# Patient Record
Sex: Male | Born: 1950 | Race: White | Hispanic: No | Marital: Married | Smoking: Former smoker
Health system: Southern US, Community
[De-identification: ages and names within clinical notes are randomized; demographics above are authoritative.]

## PROBLEM LIST (undated history)

## (undated) DIAGNOSIS — F32A Depression, unspecified: Secondary | ICD-10-CM

## (undated) DIAGNOSIS — C801 Malignant (primary) neoplasm, unspecified: Secondary | ICD-10-CM

## (undated) DIAGNOSIS — Z860100 Personal history of colon polyps, unspecified: Secondary | ICD-10-CM

## (undated) DIAGNOSIS — E785 Hyperlipidemia, unspecified: Secondary | ICD-10-CM

## (undated) DIAGNOSIS — F329 Major depressive disorder, single episode, unspecified: Secondary | ICD-10-CM

## (undated) DIAGNOSIS — K5790 Diverticulosis of intestine, part unspecified, without perforation or abscess without bleeding: Secondary | ICD-10-CM

## (undated) DIAGNOSIS — H409 Unspecified glaucoma: Secondary | ICD-10-CM

## (undated) DIAGNOSIS — F419 Anxiety disorder, unspecified: Secondary | ICD-10-CM

## (undated) DIAGNOSIS — K219 Gastro-esophageal reflux disease without esophagitis: Secondary | ICD-10-CM

## (undated) DIAGNOSIS — I10 Essential (primary) hypertension: Secondary | ICD-10-CM

## (undated) DIAGNOSIS — M1990 Unspecified osteoarthritis, unspecified site: Secondary | ICD-10-CM

## (undated) HISTORY — DX: Depression, unspecified: F32.A

## (undated) HISTORY — DX: Anxiety disorder, unspecified: F41.9

## (undated) HISTORY — DX: Hyperlipidemia, unspecified: E78.5

## (undated) HISTORY — DX: Gastro-esophageal reflux disease without esophagitis: K21.9

## (undated) HISTORY — DX: Malignant (primary) neoplasm, unspecified: C80.1

## (undated) HISTORY — DX: Major depressive disorder, single episode, unspecified: F32.9

## (undated) HISTORY — DX: Unspecified glaucoma: H40.9

## (undated) HISTORY — DX: Personal history of colonic polyps: Z86.010

## (undated) HISTORY — DX: Unspecified osteoarthritis, unspecified site: M19.90

## (undated) HISTORY — PX: OTHER SURGICAL HISTORY: SHX169

## (undated) HISTORY — PX: WRIST SURGERY: SHX841

## (undated) HISTORY — DX: Diverticulosis of intestine, part unspecified, without perforation or abscess without bleeding: K57.90

## (undated) HISTORY — DX: Personal history of colon polyps, unspecified: Z86.0100

---

## 2005-08-17 ENCOUNTER — Encounter: Payer: Self-pay | Admitting: Gastroenterology

## 2008-08-19 ENCOUNTER — Emergency Department (HOSPITAL_BASED_OUTPATIENT_CLINIC_OR_DEPARTMENT_OTHER): Admission: EM | Admit: 2008-08-19 | Discharge: 2008-08-19 | Payer: Self-pay | Admitting: Emergency Medicine

## 2009-05-19 ENCOUNTER — Encounter (INDEPENDENT_AMBULATORY_CARE_PROVIDER_SITE_OTHER): Payer: Self-pay | Admitting: *Deleted

## 2009-05-25 ENCOUNTER — Encounter (INDEPENDENT_AMBULATORY_CARE_PROVIDER_SITE_OTHER): Payer: Self-pay | Admitting: *Deleted

## 2009-05-30 ENCOUNTER — Ambulatory Visit: Payer: Self-pay | Admitting: Gastroenterology

## 2009-06-03 ENCOUNTER — Telehealth: Payer: Self-pay | Admitting: Gastroenterology

## 2009-06-07 ENCOUNTER — Ambulatory Visit: Payer: Self-pay | Admitting: Gastroenterology

## 2009-06-07 ENCOUNTER — Telehealth: Payer: Self-pay | Admitting: Gastroenterology

## 2010-02-14 NOTE — Miscellaneous (Signed)
Summary: [facility] Previsit/prep  Clinical Lists Changes  Medications: Added new medication of MOVIPREP 100 GM  SOLR (PEG-KCL-NACL-NASULF-NA ASC-C) As per prep instructions. - Signed Rx of MOVIPREP 100 GM  SOLR (PEG-KCL-NACL-NASULF-NA ASC-C) As per prep instructions.;  #1 x 0;  Signed;  Entered by: Wyona Almas RN;  Authorized by: Rachael Fee MD;  Method used: Electronically to [facility]. [phone]*, [address], [place], [place], [place]  [address], Ph: [phone] or [phone], Fax: [phone] Allergies: Added new allergy or adverse reaction of CODEINE Observations: Added new observation of NKA: F (05/30/2009 12:47)    Prescriptions: MOVIPREP 100 GM  SOLR (PEG-KCL-NACL-NASULF-NA ASC-C) As per prep instructions.  #1 x 0   Entered by:   Wyona Almas RN   Authorized by:   Rachael Fee MD   Signed by:   Wyona Almas RN on 05/30/2009   Method used:   Electronically to        [facility]. [phone]* (retail)       [address]       [place], [place]  [address]       Ph: [phone] or [phone]       Fax: [phone]   RxID:   [id number]

## 2010-02-14 NOTE — Letter (Signed)
Summary: Previsit letter  [facility]  [address] [place], [place] [address]   Phone: [phone]  Fax: [phone]       05/19/2009 MRN: [id number]  Samuel Knight [address] [place], [place]  [address]  Dear Mr. BASTIN,  Welcome to the Gastroenterology Division at [facility].    You are scheduled to see a nurse for your pre-procedure visit on 05-27-09 at 2pm on the 3rd floor at [facility], [address]  We ask that you try to arrive at our office 15 minutes prior to your appointment time to allow for check-in.  Your nurse visit will consist of discussing your medical and surgical history, your immediate family medical history, and your medications.    Please bring a complete list of all your medications or, if you prefer, bring the medication bottles and we will list them.  We will need to be aware of both prescribed and over the counter drugs.  We will need to know exact dosage information as well.  If you are on blood thinners (Coumadin, Plavix, Aggrenox, Ticlid, etc.) please call our office today/prior to your appointment, as we need to consult with your physician about holding your medication.   Please be prepared to read and sign documents such as consent forms, a financial agreement, and acknowledgement forms.  If necessary, and with your consent, a friend or relative is welcome to sit-in on the nurse visit with you.  Please bring your insurance card so that we may make a copy of it.  If your insurance requires a referral to see a specialist, please bring your referral form from your primary care physician.  No co-pay is required for this nurse visit.     If you cannot keep your appointment, please call [phone] to cancel or reschedule prior to your appointment date.  This allows [place] the opportunity to schedule an appointment for another patient in need of care.    Thank you for choosing [facility] for your medical needs.  We  appreciate the opportunity to care for you.  Please visit [place] at our website  to learn more about our practice.                     Sincerely.                                                                                                                   The Gastroenterology Division

## 2010-02-14 NOTE — Letter (Signed)
Summary: Moviprep Instructions  [facility]  [address]   [place], [place] [address]   Phone: [phone]  Fax: [phone]       CATHY ROPP    1950-10-14    MRN: [id number]        Procedure Day /Date: Tuesday    06-07-09     Arrival Time: 10:30 a.m.     Procedure Time: 11:30 a.m.     Location of Procedure:                     x   Endoscopy Center (4th Floor)                        PREPARATION FOR COLONOSCOPY WITH MOVIPREP   Starting 5 days prior to your procedure  06-02-09 do not eat nuts, seeds, popcorn, corn, beans, peas,  salads, or any raw vegetables.  Do not take any fiber supplements (e.g. Metamucil, Citrucel, and Benefiber).  THE DAY BEFORE YOUR PROCEDURE         DATE:  06-06-09  DAY: Monday  1.  Drink clear liquids the entire day-NO SOLID FOOD  2.  Do not drink anything colored red or purple.  Avoid juices with pulp.  No orange juice.  3.  Drink at least 64 oz. (8 glasses) of fluid/clear liquids during the day to prevent dehydration and help the prep work efficiently.  CLEAR LIQUIDS INCLUDE: Water Jello Ice Popsicles Tea (sugar ok, no milk/cream) Powdered fruit flavored drinks Coffee (sugar ok, no milk/cream) Gatorade Juice: apple, white grape, white cranberry  Lemonade Clear bullion, consomm, broth Carbonated beverages (any kind) Strained chicken noodle soup Hard Candy                             4.  In the morning, mix first dose of MoviPrep solution:    Empty 1 Pouch A and 1 Pouch B into the disposable container    Add lukewarm drinking water to the top line of the container. Mix to dissolve    Refrigerate (mixed solution should be used within 24 hrs)  5.  Begin drinking the prep at 5:00 p.m. The MoviPrep container is divided by 4 marks.   Every 15 minutes drink the solution down to the next mark (approximately 8 oz) until the full liter is complete.   6.  Follow completed prep with 16 oz of clear liquid of your choice  (Nothing red or purple).  Continue to drink clear liquids until bedtime.  7.  Before going to bed, mix second dose of MoviPrep solution:    Empty 1 Pouch A and 1 Pouch B into the disposable container    Add lukewarm drinking water to the top line of the container. Mix to dissolve    Refrigerate  THE DAY OF YOUR PROCEDURE      DATE:  06-07-09  DAY: Tuesday  Beginning at 6:30 a.m. (5 hours before procedure):         1. Every 15 minutes, drink the solution down to the next mark (approx 8 oz) until the full liter is complete.  2. Follow completed prep with 16 oz. of clear liquid of your choice.    3. You may drink clear liquids until  9:30 a.m.  (2 HOURS BEFORE PROCEDURE).   MEDICATION INSTRUCTIONS  Unless otherwise instructed, you should take regular prescription medications with a small sip of water  as early as possible the morning of your procedure.         OTHER INSTRUCTIONS  You will need a responsible adult at least 59 years of age to accompany you and drive you home.   This person must remain in the waiting room during your procedure.  Wear loose fitting clothing that is easily removed.  Leave jewelry and other valuables at home.  However, you may wish to bring a book to read or  an iPod/MP3 player to listen to music as you wait for your procedure to start.  Remove all body piercing jewelry and leave at home.  Total time from sign-in until discharge is approximately 2-3 hours.  You should go home directly after your procedure and rest.  You can resume normal activities the  day after your procedure.  The day of your procedure you should not:   Drive   Make legal decisions   Operate machinery   Drink alcohol   Return to work  You will receive specific instructions about eating, activities and medications before you leave.    The above instructions have been reviewed and explained to me by   Wyona Almas RN  May 30, 2009 1:19 PM     I fully  understand and can verbalize these instructions _____________________________ Date _________

## 2010-02-14 NOTE — Procedures (Signed)
Summary: [facility]   Imported By: Sherian Rein 06/14/2009 09:18:26  _____________________________________________________________________  External Attachment:    Type:   Image     Comment:   External Document

## 2010-02-14 NOTE — Progress Notes (Signed)
Summary: Triage  Phone Note Call from Patient Call back at [phone]   Caller: Patient Call For: Dr. Christella Hartigan Reason for Call: Talk to Nurse Summary of Call: pt. has a procedure sch'd for today. Finished last night's prep and started on it this morning and started vomiting and "gagging". Initial call taken by: Karna Christmas,  Jun 07, 2009 8:06 AM  Follow-up for Phone Call        Phone call returned to pt.  He drank all of the first bottle of the moviprep last pm.  This am he drank 3/4 of the bottle and had n & v.  I asked the pt what his last bm looked like.  Per pt, "it's all liquid, yellowish colored."  I informed the pt.  we would proceed with the colonoscopy this am.  Pt is scheduled for 11:30 and to arrive at 10:30.  Pt will not try to finish the rest of the prep. Follow-up by: Eual Fines RN,  Jun 07, 2009 8:39 AM

## 2010-02-14 NOTE — Procedures (Signed)
Summary: Colonoscopy  Patient: Samuel Knight Note: All result statuses are Final unless otherwise noted.  Tests: (1) Colonoscopy (COL)   COL Colonoscopy           DONE     [facility]     [address]     [place], [place]  [address]           COLONOSCOPY PROCEDURE REPORT           PATIENT:  Samuel Knight, Samuel Knight  MR#:  [id number]     BIRTHDATE:  06/01/1950, 58 yrs. old  GENDER:  male     ENDOSCOPIST:  Rachael Fee, MD     REF. BY:  Feliciana Rossetti, M.D.     PROCEDURE DATE:  06/07/2009     PROCEDURE:  Diagnostic Colonoscopy     ASA CLASS:  Class II     INDICATIONS:  history of pre-cancerous (adenomatous) colon polyps,     2007 Dr. Jennye Boroughs     MEDICATIONS:   Fentanyl 75 mcg IV, Versed 8 mg IV           DESCRIPTION OF PROCEDURE:   After the risks benefits and     alternatives of the procedure were thoroughly explained, informed     consent was obtained.  Digital rectal exam was performed and     revealed no rectal masses.   The LB [id number] endoscope     was introduced through the anus and advanced to the cecum, which     was identified by both the appendix and ileocecal valve, without     limitations.  The quality of the prep was good, using MoviPrep.     The instrument was then slowly withdrawn as the colon was fully     examined.     <<PROCEDUREIMAGES>>           FINDINGS:  Mild diverticulosis was found in the sigmoid to     descending colon segments (see image2).  This was otherwise a     normal examination of the colon (see image4, image5, and image6).     Retroflexed views in the rectum revealed no abnormalities.    The     scope was then withdrawn from the patient and the procedure     completed.           COMPLICATIONS:  None     ENDOSCOPIC IMPRESSION:     1) Mild diverticulosis in the sigmoid to descending colon     segments     2) Otherwise normal examination     3) No polyps or cancers           RECOMMENDATIONS:     1) Given your personal  history of precancerous polyps (and     possible family history of colon cancer), you should have a repeat     colonoscopy in 5 years           REPEAT EXAM:  5 years           ______________________________     Rachael Fee, MD           n.     eSIGNED:   Rachael Fee at 06/07/2009 12:23 PM           Jaclynn Guarneri, [id number]  Note: An exclamation mark (!) indicates a result that was not dispersed into the flowsheet. Document Creation Date: 06/07/2009 12:24 PM _______________________________________________________________________  (1) Order result status: Final Collection or  observation date-time: 06/07/2009 12:20 Requested date-time:  Receipt date-time:  Reported date-time:  Referring Physician:   Ordering Physician: Rob Bunting [phone]) Specimen Source:  Source: Launa Grill Order Number: [id number] Lab site:   Appended Document: Colonoscopy    Clinical Lists Changes  Observations: Added new observation of COLONNXTDUE: 05/2014 (06/07/2009 13:12)

## 2010-02-14 NOTE — Progress Notes (Signed)
Summary: request gi record from hosp  Phone Note Call from Patient Call back at Home Phone [phone]   Caller: Spouse Call For: Samuel Knight Reason for Call: Talk to Nurse Summary of Call: Wife tried getting her husbands old colon report from [facility]. but they tol dher that we needed to fax over a request for them to fax over his records. Pateitn is schedule to have a colon on Tues. Please call [phone] Hosp. Initial call taken by: Tawni Levy,  Jun 03, 2009 10:28 AM  Follow-up for Phone Call        release faxed  Follow-up by: Chales Abrahams CMA Duncan Dull),  Jun 03, 2009 10:41 AM

## 2013-12-30 ENCOUNTER — Emergency Department: Payer: Self-pay | Admitting: Emergency Medicine

## 2013-12-30 LAB — BASIC METABOLIC PANEL
Anion Gap: 6 — ABNORMAL LOW (ref 7–16)
BUN: 14 mg/dL (ref 7–18)
CHLORIDE: 107 mmol/L (ref 98–107)
CO2: 27 mmol/L (ref 21–32)
Calcium, Total: 8.5 mg/dL (ref 8.5–10.1)
Creatinine: 1.05 mg/dL (ref 0.60–1.30)
EGFR (African American): >60
Glucose: 118 mg/dL — ABNORMAL HIGH (ref 65–99)
OSMOLALITY: 281 (ref 275–301)
Potassium: 4.2 mmol/L (ref 3.5–5.1)
SODIUM: 140 mmol/L (ref 136–145)

## 2013-12-30 LAB — CBC
HCT: 47.5 % (ref 40.0–52.0)
HGB: 16.2 g/dL (ref 13.0–18.0)
MCH: 33.7 pg (ref 26.0–34.0)
MCHC: 34.1 g/dL (ref 32.0–36.0)
MCV: 99 fL (ref 80–100)
Platelet: 160 10*3/uL (ref 150–440)
RBC: 4.8 10*6/uL (ref 4.40–5.90)
RDW: 13.1 % (ref 11.5–14.5)
WBC: 4.8 10*3/uL (ref 3.8–10.6)

## 2013-12-30 LAB — TROPONIN I

## 2014-01-14 ENCOUNTER — Telehealth: Payer: Self-pay

## 2014-01-14 NOTE — Telephone Encounter (Signed)
Called pt to schedule a ED f/u. Pt states he has already taken care of his appt with his "medical Dr. Marland Kitchen

## 2014-03-24 ENCOUNTER — Encounter: Payer: Self-pay | Admitting: Gastroenterology

## 2014-04-02 ENCOUNTER — Encounter: Payer: Self-pay | Admitting: *Deleted

## 2014-04-02 ENCOUNTER — Ambulatory Visit: Payer: Self-pay | Admitting: Nurse Practitioner

## 2014-04-27 ENCOUNTER — Ambulatory Visit: Payer: Self-pay | Admitting: Nurse Practitioner

## 2014-04-28 ENCOUNTER — Telehealth: Payer: Self-pay | Admitting: *Deleted

## 2014-04-28 NOTE — Telephone Encounter (Signed)
Called patient to advise he missed his appointment with [place] yesterday with Tye Savoy NP-C 04-27-2014.  He said his wife remembered and ask him last night if he went to his appointment. He forgot.  I rescheduled him for 05-12-2014 at 3:00 PM with Amy Estewrood PA-C.

## 2014-05-12 ENCOUNTER — Ambulatory Visit (INDEPENDENT_AMBULATORY_CARE_PROVIDER_SITE_OTHER): Payer: BLUE CROSS/BLUE SHIELD | Admitting: Physician Assistant

## 2014-05-12 ENCOUNTER — Encounter: Payer: Self-pay | Admitting: Physician Assistant

## 2014-05-12 VITALS — BP 130/68 | HR 68 | Ht 67.0 in | Wt 194.0 lb

## 2014-05-12 DIAGNOSIS — Z1211 Encounter for screening for malignant neoplasm of colon: Secondary | ICD-10-CM | POA: Diagnosis not present

## 2014-05-12 DIAGNOSIS — Z86010 Personal history of colonic polyps: Secondary | ICD-10-CM | POA: Insufficient documentation

## 2014-05-12 DIAGNOSIS — K219 Gastro-esophageal reflux disease without esophagitis: Secondary | ICD-10-CM

## 2014-05-12 DIAGNOSIS — E785 Hyperlipidemia, unspecified: Secondary | ICD-10-CM | POA: Diagnosis not present

## 2014-05-12 MED ORDER — MOVIPREP 100 G PO SOLR: 1.0000 | ORAL | Status: DC

## 2014-05-12 NOTE — Patient Instructions (Signed)
You have been scheduled for an endoscopy and colonoscopy. Please follow the written instructions given to you at your visit today. Please pick up your prep supplies at the pharmacy within the next 1-3 days. [place], [place]. If you use inhalers (even only as needed), please bring them with you on the day of your procedure. Your physician has requested that you go to www.startemmi.com and enter the access code given to you at your visit today. This web site gives a general overview about your procedure. However, you should still follow specific instructions given to you by our office regarding your preparation for the procedure.

## 2014-05-12 NOTE — Progress Notes (Signed)
i agree with the above note, plan 

## 2014-05-12 NOTE — Progress Notes (Signed)
Patient ID: Samuel Knight, male   DOB: 06/19/1950, 64 y.o.   MRN: [id number]   Subjective:    Patient ID: Samuel Knight, male    DOB: 06/28/1950, 64 y.o.   MRN: [id number]  HPI Samuel Knight is a pleasant 64 yo male known to Dr. Jacobs from prior colonoscopy who comes in today to discuss follow-up colonoscopy. When his wife made this appointment he was having some problems with diarrhea which she says lasted for about a week and has since resolved. No current complaints of abdominal pain and changes in bowel habits melena or hematochezia. Last colonoscopy was done in May 2011 he had mild left-sided diverticulosis and no polyps that colonoscopy in 2007 with Dr. Misenheimer with adenomatous polyps. He was slated for 5 year interval follow-up. His prior records states family history of colon cancer but he tells me today he does not have family history of colon cancer. Patient has recently started on omeprazole 20 mg by mouth daily over the past few months for reflux symptoms says he has had problems for 8 or 9 years with intermittent heartburn and indigestion no dysphagia and [facility] omeprazole. He says this does help control his symptoms very well. He has not had prior EGD.  Review of Systems Pertinent positive and negative review of systems were noted in the above HPI section.  All other review of systems was otherwise negative.  Outpatient Encounter Prescriptions as of 05/12/2014  Medication Sig  . aspirin 81 MG tablet Take 81 mg by mouth daily.  . buPROPion (WELLBUTRIN SR) 150 MG 12 hr tablet Take 150 mg by mouth 2 (two) times daily.  . Dorzolamide HCl-Timolol Mal PF 22.3-6.8 MG/ML SOLN Place 1 drop into both eyes 2 (two) times daily.  . dorzolamide-timolol (COSOPT) 22.3-6.8 MG/ML ophthalmic solution Place 1 drop into both eyes daily.  . fexofenadine (ALLEGRA) 180 MG tablet Take 180 mg by mouth 2 (two) times daily.  . gemfibrozil  (LOPID) 600 MG tablet Take 600 mg by mouth 2 (two) times daily before a meal.  . omeprazole (PRILOSEC) 20 MG capsule Take 20 mg by mouth daily.  . MOVIPREP 100 G SOLR Take 1 kit (200 g total) by mouth as directed.   Allergies  Allergen Reactions  . Codeine     REACTION: nausea  . Phenergan [Promethazine Hcl] Other (See Comments)    Lightheaded and nervousness   Patient Active Problem List   Diagnosis Date Noted  . Hyperlipidemia 05/12/2014  . GERD (gastroesophageal reflux disease) 05/12/2014  . Hx of adenomatous colonic polyps 05/12/2014   History   Social History  . Marital Status: Married    Spouse Name: N/A  . Number of Children: 1  . Years of Education: N/A   Occupational History  . shift manager    Social History Main Topics  . Smoking status: Former Smoker    Types: Cigarettes    Quit date: 05/12/1999  . Smokeless tobacco: Never Used  . Alcohol Use: No  . Drug Use: No  . Sexual Activity: Not on file   Other Topics Concern  . Not on file   Social History Narrative    Mr. Hapke's family history includes Alcoholism in his father; Breast cancer in his mother; Emphysema in his father; Lung cancer in his brother; Throat cancer in his brother.      Objective:    Filed Vitals:   05/12/14 1453    BP: 130/68  Pulse: 68    Physical Exam  well-developed older white male in no acute distress, pleasant blood pressure 138/68 pulse 68 height 5 foot 7 weight 194. HEENT; nontraumatic normocephalic EOMI PERRLA sclera anicteric, Supple; no JVD, Cardiovascular; regular rate and rhythm with S1-S2 no murmur or gallop, Pulmonary; clear bilaterally, Abdomen ;soft nontender nondistended bowel sounds are active no palpable mass or hepatosplenomegaly, Rectal; exam not done, Ext; no clubbing cyanosis or edema skin warm and dry, Psych ;mood and affect appropriate       Assessment & Plan:   #1 64 yo male with hx of adenomatous polyps 2007, negative colon 2011- her for follow up  colonoscopy. #2 chronic GERD x 8-9 years ,recently started PPI - r/o Barretts #3 hyperlipidemia  Plan; Will schedule for colonoscopy and EGD with Dr Ardis Hughs- procedures discussed in detail with pt and he is agreeable to proceed. He will continue Omepraole  20 mg po daily in am   Amy S Esterwood PA-C 05/12/2014   Cc: No ref. provider found

## 2014-05-13 ENCOUNTER — Encounter: Payer: Self-pay | Admitting: Gastroenterology

## 2014-06-07 ENCOUNTER — Encounter: Payer: Self-pay | Admitting: Gastroenterology

## 2014-06-11 ENCOUNTER — Encounter: Payer: BLUE CROSS/BLUE SHIELD | Admitting: Gastroenterology

## 2014-10-15 ENCOUNTER — Encounter: Payer: Self-pay | Admitting: Gastroenterology

## 2014-10-26 ENCOUNTER — Ambulatory Visit (INDEPENDENT_AMBULATORY_CARE_PROVIDER_SITE_OTHER): Payer: BLUE CROSS/BLUE SHIELD | Admitting: Gastroenterology

## 2014-10-26 ENCOUNTER — Encounter: Payer: Self-pay | Admitting: Gastroenterology

## 2014-10-26 VITALS — BP 112/70 | HR 60 | Ht 67.0 in | Wt 193.4 lb

## 2014-10-26 DIAGNOSIS — K219 Gastro-esophageal reflux disease without esophagitis: Secondary | ICD-10-CM | POA: Diagnosis not present

## 2014-10-26 DIAGNOSIS — R194 Change in bowel habit: Secondary | ICD-10-CM | POA: Diagnosis not present

## 2014-10-26 DIAGNOSIS — R198 Other specified symptoms and signs involving the digestive system and abdomen: Secondary | ICD-10-CM

## 2014-10-26 MED ORDER — MOVIPREP 100 G PO SOLR: 1.0000 | Freq: Once | ORAL | Status: DC

## 2014-10-26 NOTE — Patient Instructions (Signed)
You will be set up for a colonoscopy for change in bowels, constipation. Please start taking citrucel (orange flavored) powder fiber supplement.  This may cause some bloating at first but that usually goes away. Begin with a small spoonful and work your way up to a large, heaping spoonful daily over a week. You will be set up for an upper endoscopy to screen for Barrett's, chronic GERD.

## 2014-10-26 NOTE — Progress Notes (Signed)
Review of pertinent gastrointestinal problems: 1. Adenomatous polyp in colon: 08/2005 colonsocopy Dr. Trenton Founds found 3 small (subCM polyps) one was adenomatous on pathology. Repeat colonoscopy 2011 Dr. Ardis Hughs found no polyps.  Was recommended to have recall at 5 year interval. 2. Diverticulosis noted on 2007, 2011 colonoscopies  HPI: This is a  very pleasant 64 year old man whom I last saw about 5 years ago.  Chief complaint is change in bowels, constipation  He was scheduled for EGD and  Colonoscopy but did not show.  He has had recent constipation; This has been for the past 1-2 months. No bleeding.  No new meds (no pain meds,  No BP meds).  Has minor intermittent pains in abd  Had a CT scan 09/2014, tells me no issues with GI tract.  But + lesion in kidney. Follow up MRI showed no problem in kidneys  He has had GERD symptoms for 8-10 years. Currently well controlled on proton pump inhibitor once daily. He has never had Barrett's screening. He has no alarm symptoms of dysphagia, unintentional weight loss.  Sister with IBS, mother with constipation.  No colon cancer in family.  No change in frequency of BMs, just has to push and strain to have BMs.  Has been losing weight.    Was seen in office 04/2014 AE, GERD for 9 years, recommended to have EGD and colonoscopy at that time (colon for polyp follow up, EGD for Barrett's screening)   Past Medical History  Diagnosis Date  . Diverticulosis   . History of colon polyps   . Anxiety   . Arthritis   . 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 Depression   . Glaucoma   . HLD (hyperlipidemia)   . GERD (gastroesophageal reflux disease)     Past Surgical History  Procedure Laterality Date  . Wrist surgery Right     cyst removal   . Eye lid surgery Right     cancer    Current Outpatient Prescriptions  Medication Sig Dispense Refill  . aspirin 81 MG tablet Take 81 mg by mouth daily.    Marland Kitchen buPROPion (WELLBUTRIN SR) 150 MG 12 hr  tablet Take 150 mg by mouth 2 (two) times daily.    . Dorzolamide HCl-Timolol Mal PF 22.3-6.8 MG/ML SOLN Place 1 drop into both eyes 2 (two) times daily.    . dorzolamide-timolol (COSOPT) 22.3-6.8 MG/ML ophthalmic solution Place 1 drop into both eyes daily.    . fexofenadine (ALLEGRA) 180 MG tablet Take 180 mg by mouth 2 (two) times daily.    Marland Kitchen gemfibrozil (LOPID) 600 MG tablet Take 600 mg by mouth 2 (two) times daily before a meal.    . omeprazole (PRILOSEC) 20 MG capsule Take 20 mg by mouth daily.    Marland Kitchen PRESCRIPTION MEDICATION Take by mouth daily.     No current facility-administered medications for this visit.    Allergies as of 10/26/2014 - Review Complete 10/26/2014  Allergen Reaction Noted  . Codeine  05/30/2009  . Phenergan [promethazine hcl] Other (See Comments) 05/12/2014    Family History  Problem Relation Age of Onset  . Breast cancer Mother   . Lung cancer Brother   . Throat cancer Brother   . Emphysema Father   . Alcoholism Father     Social History   Social History  . Marital Status: Married    Spouse Name: N/A  . Number of Children: 1  . Years of Education: N/A   Occupational  History  . shift manager    Social History Main Topics  . Smoking status: Former Smoker    Types: Cigarettes    Quit date: 05/12/1999  . Smokeless tobacco: Never Used  . Alcohol Use: No  . Drug Use: No  . Sexual Activity: Not on file   Other Topics Concern  . Not on file   Social History Narrative     Physical Exam: BP 112/70 mmHg  Pulse 60  Ht 5\' 7"  (1.702 m)  Wt 193 lb 6 oz (87.714 kg)  BMI 30.28 kg/m2 Constitutional: generally well-appearing Psychiatric: alert and oriented x3 Abdomen: soft, nontender, nondistended, no obvious ascites, no peritoneal signs, normal bowel sounds   Assessment and plan: 64 y.o. male with change in bowel habits recently, chronic GERD  For his change in bowel habits,     constipation I recommended we proceed with colonoscopy at his  soonest convenience. He has only had one low risk adenoma detected and that was 8-10 years ago. If colonoscopy finds no repeat polyps then he would not need screening again for 10 years. He has chronic GERD without alarm symptoms. He is male, Caucasian, over 29, he is obese and he therefore qualifies for Barrett's screening. We will schedule EGD at same time as his colonoscopy   Owens Loffler, MD [facility] 10/26/2014, 9:26 AM

## 2014-10-27 ENCOUNTER — Encounter: Payer: BLUE CROSS/BLUE SHIELD | Admitting: Gastroenterology

## 2014-11-19 ENCOUNTER — Encounter: Payer: Self-pay | Admitting: Gastroenterology

## 2014-11-26 ENCOUNTER — Encounter: Payer: Self-pay | Admitting: Gastroenterology

## 2014-12-04 ENCOUNTER — Telehealth: Payer: Self-pay | Admitting: Gastroenterology

## 2014-12-04 DIAGNOSIS — K219 Gastro-esophageal reflux disease without esophagitis: Secondary | ICD-10-CM

## 2014-12-04 DIAGNOSIS — R198 Other specified symptoms and signs involving the digestive system and abdomen: Secondary | ICD-10-CM

## 2014-12-04 MED ORDER — NA SULFATE-K SULFATE-MG SULF 17.5-3.13-1.6 GM/177ML PO SOLN: 1.0000 | Freq: Once | ORAL | Status: AC

## 2014-12-04 MED ORDER — MOVIPREP 100 G PO SOLR: 1.0000 | Freq: Once | ORAL | Status: AC

## 2014-12-04 NOTE — Telephone Encounter (Signed)
Moviprep not covered by insurance, sent Rx for [employer]

## 2014-12-04 NOTE — Telephone Encounter (Signed)
Patient called to ask for Rx for colon prep. He has instructions bowel prep. Sent Moviprep Rx to his pharmacy

## 2014-12-06 ENCOUNTER — Encounter: Payer: BLUE CROSS/BLUE SHIELD | Admitting: Gastroenterology

## 2014-12-06 ENCOUNTER — Telehealth: Payer: Self-pay | Admitting: Gastroenterology

## 2014-12-06 NOTE — Telephone Encounter (Signed)
No charge. 

## 2015-01-18 ENCOUNTER — Encounter: Payer: BLUE CROSS/BLUE SHIELD | Admitting: Gastroenterology

## 2015-03-01 ENCOUNTER — Ambulatory Visit (AMBULATORY_SURGERY_CENTER): Payer: Self-pay | Admitting: *Deleted

## 2015-03-01 VITALS — Ht 67.5 in | Wt 193.8 lb

## 2015-03-01 DIAGNOSIS — K219 Gastro-esophageal reflux disease without esophagitis: Secondary | ICD-10-CM

## 2015-03-01 DIAGNOSIS — R194 Change in bowel habit: Secondary | ICD-10-CM

## 2015-03-01 NOTE — Progress Notes (Signed)
Denies allergies to eggs or soy products. Denies complications with sedation or anesthesia. Denies O2 use. Denies use of diet or weight loss medications.  Emmi instructions given for colonoscopy.  Patient had previously been scheduled for colonoscopy and has Moviprep solution. Instructions given so that patient could use what was already purchased.

## 2015-03-10 ENCOUNTER — Telehealth: Payer: Self-pay | Admitting: Gastroenterology

## 2015-03-11 ENCOUNTER — Encounter: Payer: Self-pay | Admitting: Gastroenterology

## 2015-03-11 ENCOUNTER — Encounter: Payer: BLUE CROSS/BLUE SHIELD | Admitting: Gastroenterology

## 2015-03-11 ENCOUNTER — Telehealth: Payer: Self-pay | Admitting: *Deleted

## 2015-03-11 NOTE — Telephone Encounter (Signed)
Called pt, Lm verifying pt cancelled his procedure for today 03/11/15-adm

## 2015-03-11 NOTE — Telephone Encounter (Signed)
Late entry- 03-10-15 at 1650I spoke with pt about his prep over the phone.  He states he has Moviprep instructions and has the Suprep at home.  I read over the instructions and he told me that he is writing them down as we talk.  Pt states he understands instructions and has no further questions.

## 2015-03-11 NOTE — Telephone Encounter (Signed)
yes

## 2015-06-24 IMAGING — CR DG CHEST 1V PORT
1 series · 1 of 1 positions shown · non-contrast
Comparison: 07/11/2008 and earlier.

CLINICAL DATA: 63-year-old male with substernal chest pain since
[REDACTED] with shortness of Breath. Initial encounter.

EXAM:
PORTABLE CHEST - 1 VIEW

[ap]
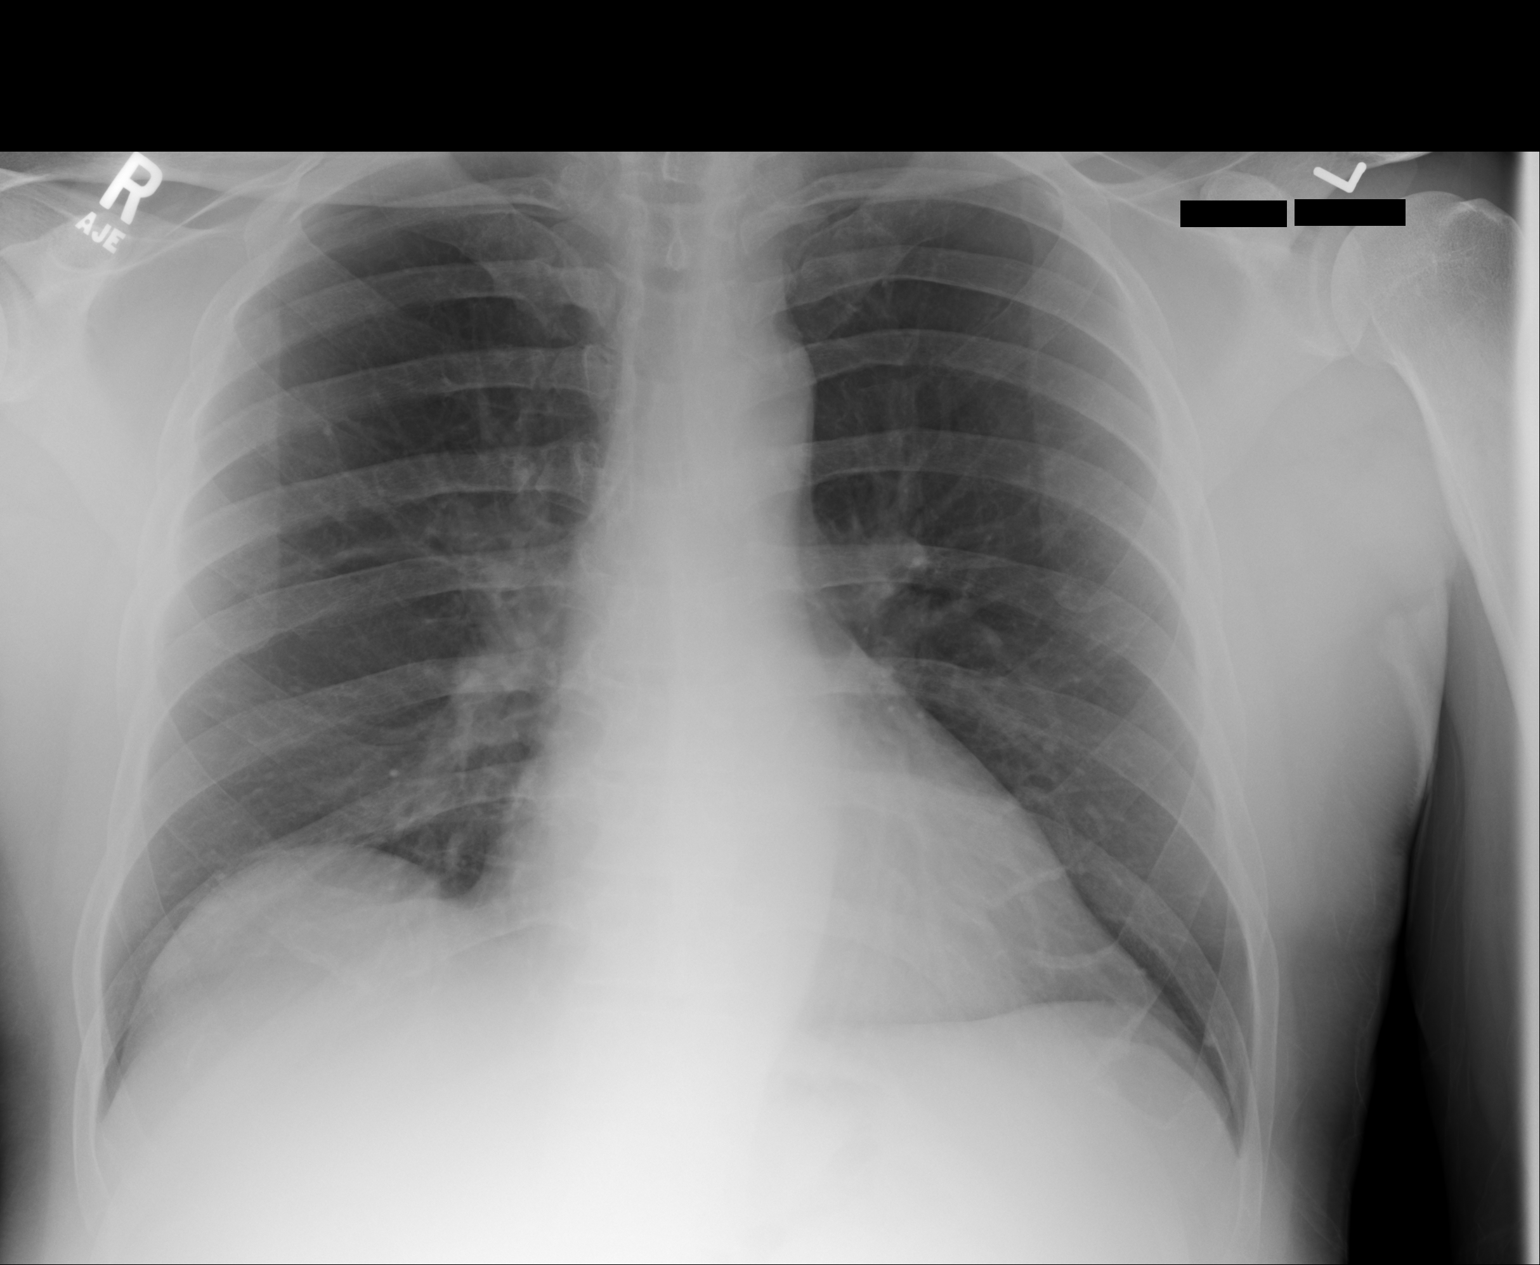

[1 of 1 positions shown; findings below may reference images not displayed]

FINDINGS: Portable AP semi upright view at 8390 hrs. Stable and normal lung
volumes with mild elevation or eventration of the right
hemidiaphragm. Normal cardiac size and mediastinal contours.
Visualized tracheal air column is within normal limits. Allowing for
portable technique, the lungs are clear. No pneumothorax or pleural
effusion.
IMPRESSION: Negative portable chest, no acute cardiopulmonary abnormality.

## 2015-11-14 ENCOUNTER — Ambulatory Visit (INDEPENDENT_AMBULATORY_CARE_PROVIDER_SITE_OTHER): Payer: BLUE CROSS/BLUE SHIELD | Admitting: Gastroenterology

## 2015-11-14 ENCOUNTER — Encounter: Payer: Self-pay | Admitting: Gastroenterology

## 2015-11-14 VITALS — BP 122/80 | HR 74 | Ht 68.0 in | Wt 200.0 lb

## 2015-11-14 DIAGNOSIS — R1013 Epigastric pain: Secondary | ICD-10-CM | POA: Diagnosis not present

## 2015-11-14 DIAGNOSIS — Z86010 Personal history of colonic polyps: Secondary | ICD-10-CM | POA: Diagnosis not present

## 2015-11-14 DIAGNOSIS — K219 Gastro-esophageal reflux disease without esophagitis: Secondary | ICD-10-CM

## 2015-11-14 MED ORDER — NA SULFATE-K SULFATE-MG SULF 17.5-3.13-1.6 GM/177ML PO SOLN: 1.0000 | Freq: Once | ORAL | 0 refills | Status: AC

## 2015-11-14 NOTE — Progress Notes (Signed)
Review of pertinent gastrointestinal problems: 1. Adenomatous polyp in colon: 08/2005 colonsocopy Dr. Trenton Founds found 3 small (subCM polyps) one was adenomatous on pathology. Repeat colonoscopy 2011 Dr. Ardis Hughs found no polyps.  Was recommended to have recall at 5 year interval. 2. Diverticulosis noted on 2007, 2011 colonoscopies 3. Chronic GERD.   HPI: This is a  very pleasant 65 year old man whom I last saw little over a year ago.  chief complaint is belching, dyspepsia, nausea  He has had intermittent dyspeptic episodes with mild nausea. He describes a lot of belching and bloating around these times.  He is on once daily over-the-counter omeprazole.  Weight is up 7 pounds since last visit here 1 year ago.  He was seen here in our office  by Arminda Resides would April 2016 for chronic upper GI symptoms, history of adenomatous polyps and was recommended to have a colonoscopy and upper endoscopy. He never had those.  I saw him 6 months later for almost identical complaints and recommended the same; colonoscopy and upper endoscopy. He twice canceled those procedures at last minute.  This is the first we have heard from him in about a year now    ROS: complete GI ROS as described in HPI.  Constitutional:  No unintentional weight loss   Past Medical History:  Diagnosis Date  . Anxiety   . Arthritis   . 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 Depression   . Diverticulosis   . GERD (gastroesophageal reflux disease)   . Glaucoma   . History of colon polyps   . HLD (hyperlipidemia)     Past Surgical History:  Procedure Laterality Date  . EYE Lid Surgery Right    cancer  . WRIST SURGERY Right    cyst removal     Current Outpatient Prescriptions  Medication Sig Dispense Refill  . aspirin 81 MG tablet Take 81 mg by mouth daily.    Marland Kitchen buPROPion (WELLBUTRIN SR) 150 MG 12 hr tablet Take 150 mg by mouth 2 (two) times daily.    . Dorzolamide HCl-Timolol Mal PF 22.3-6.8 MG/ML SOLN Place 1 drop  into both eyes 2 (two) times daily.    . dorzolamide-timolol (COSOPT) 22.3-6.8 MG/ML ophthalmic solution Place 1 drop into both eyes daily.    . fexofenadine (ALLEGRA) 180 MG tablet Take 180 mg by mouth 2 (two) times daily.    Marland Kitchen gemfibrozil (LOPID) 600 MG tablet Take 600 mg by mouth 2 (two) times daily before a meal.    . MOVIPREP 100 G SOLR Take 1 kit (200 g total) by mouth once. 1 kit 0  . omeprazole (PRILOSEC) 20 MG capsule Take 20 mg by mouth daily.    Marland Kitchen PRESCRIPTION MEDICATION Take by mouth daily. citrucel     No current facility-administered medications for this visit.     Allergies as of 11/14/2015 - Review Complete 11/14/2015  Allergen Reaction Noted  . Codeine  05/30/2009  . Phenergan [promethazine hcl] Other (See Comments) 05/12/2014    Family History  Problem Relation Age of Onset  . Breast cancer Mother   . Lung cancer Brother   . Throat cancer Brother   . Emphysema Father   . Alcoholism Father   . Colon cancer Neg Hx     Social History   Social History  . Marital status: Married    Spouse name: N/A  . Number of children: 1  . Years of education: N/A   Occupational History  . shift Freight forwarder  Social History Main Topics  . Smoking status: Former Smoker    Types: Cigarettes    Quit date: 05/12/1999  . Smokeless tobacco: Never Used  . Alcohol use No  . Drug use: No  . Sexual activity: Not on file   Other Topics Concern  . Not on file   Social History Narrative  . No narrative on file     Physical Exam: BP 122/80   Pulse 74   Ht '5\' 8"'$  (1.727 m)   Wt 200 lb (90.7 kg)   BMI 30.41 kg/m  Constitutional: generally well-appearing Psychiatric: alert and oriented x3 Abdomen: soft, nontender, nondistended, no obvious ascites, no peritoneal signs, normal bowel sounds No peripheral edema noted in lower extremities  Assessment and plan: 65 y.o. male witchronic dyspepsia, chronic GERD, history of adenomatous polyps  we have had this visit with him twice  in the past and I recommended upper endoscopy and colonoscopy both times. I recommended these tests again this time. I see no reason for any further blood tests or imaging studies prior to then.   Owens Loffler, MD [facility] 11/14/2015, 3:51 PM

## 2015-11-14 NOTE — Patient Instructions (Signed)

## 2016-01-23 ENCOUNTER — Telehealth: Payer: Self-pay | Admitting: Gastroenterology

## 2016-01-23 NOTE — Telephone Encounter (Signed)
Ok, thanks.

## 2016-01-24 ENCOUNTER — Encounter: Payer: BLUE CROSS/BLUE SHIELD | Admitting: Gastroenterology

## 2016-03-26 ENCOUNTER — Encounter: Payer: BLUE CROSS/BLUE SHIELD | Admitting: Gastroenterology

## 2016-04-20 ENCOUNTER — Encounter: Payer: Self-pay | Admitting: Gastroenterology

## 2016-06-22 ENCOUNTER — Encounter: Payer: BLUE CROSS/BLUE SHIELD | Admitting: Gastroenterology

## 2017-08-05 ENCOUNTER — Ambulatory Visit: Payer: Self-pay | Admitting: Podiatry

## 2017-08-06 ENCOUNTER — Ambulatory Visit (INDEPENDENT_AMBULATORY_CARE_PROVIDER_SITE_OTHER): Payer: BLUE CROSS/BLUE SHIELD

## 2017-08-06 ENCOUNTER — Ambulatory Visit: Payer: BLUE CROSS/BLUE SHIELD | Admitting: Podiatry

## 2017-08-06 ENCOUNTER — Encounter: Payer: Self-pay | Admitting: Podiatry

## 2017-08-06 VITALS — BP 151/92 | HR 68 | Temp 97.8°F | Resp 16 | Ht 68.0 in | Wt 195.0 lb

## 2017-08-06 DIAGNOSIS — M722 Plantar fascial fibromatosis: Secondary | ICD-10-CM | POA: Diagnosis not present

## 2017-08-06 DIAGNOSIS — M216X9 Other acquired deformities of unspecified foot: Secondary | ICD-10-CM | POA: Diagnosis not present

## 2017-08-06 MED ORDER — MELOXICAM 15 MG PO TABS: 15.0000 mg | ORAL_TABLET | Freq: Every day | ORAL | 0 refills | Status: AC

## 2017-08-06 NOTE — Patient Instructions (Signed)

## 2017-08-06 NOTE — Progress Notes (Signed)
   Subjective:    Patient ID: Samuel Knight, male    DOB: March 11, 1950, 67 y.o.   MRN: [id number]  HPI    Review of Systems  All other systems reviewed and are negative.      Objective:   Physical Exam        Assessment & Plan:

## 2017-08-06 NOTE — Progress Notes (Signed)
Subjective:  Patient ID: Samuel Knight, male    DOB: 07/09/1950,  MRN: [id number]  No chief complaint on file.  67 y.o. male presents with the above complaint.  Reports pain to the medial arch bilaterally pain to the bottom of the heels worse than left side.  Present for several weeks.  Worse at the end of the day.  States that it all started once he was wearing his new safety shoes at work. Past Medical History:  Diagnosis Date  . Anxiety   . Arthritis   . 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 Depression   . Diverticulosis   . GERD (gastroesophageal reflux disease)   . Glaucoma   . History of colon polyps   . HLD (hyperlipidemia)    Past Surgical History:  Procedure Laterality Date  . EYE Lid Surgery Right    cancer  . WRIST SURGERY Right    cyst removal     Current Outpatient Medications:  .  aspirin 81 MG tablet, Take 81 mg by mouth daily., Disp: , Rfl:  .  buPROPion (WELLBUTRIN SR) 150 MG 12 hr tablet, Take 150 mg by mouth 2 (two) times daily., Disp: , Rfl:  .  Dorzolamide HCl-Timolol Mal PF 22.3-6.8 MG/ML SOLN, Place 1 drop into both eyes 2 (two) times daily., Disp: , Rfl:  .  dorzolamide-timolol (COSOPT) 22.3-6.8 MG/ML ophthalmic solution, Place 1 drop into both eyes daily., Disp: , Rfl:  .  fexofenadine (ALLEGRA) 180 MG tablet, Take 180 mg by mouth 2 (two) times daily., Disp: , Rfl:  .  gemfibrozil (LOPID) 600 MG tablet, Take 600 mg by mouth 2 (two) times daily before a meal., Disp: , Rfl:  .  MOVIPREP 100 G SOLR, Take 1 kit (200 g total) by mouth once., Disp: 1 kit, Rfl: 0 .  omeprazole (PRILOSEC) 20 MG capsule, Take 20 mg by mouth daily., Disp: , Rfl:  .  PRESCRIPTION MEDICATION, Take by mouth daily. citrucel, Disp: , Rfl:   Allergies  Allergen Reactions  . Codeine     REACTION: nausea  . Phenergan [Promethazine Hcl] Other (See Comments)    Lightheaded and nervousness   Review of Systems: Negative except as noted in the HPI. Denies N/V/F/Ch. Objective:  There  were no vitals filed for this visit. General AA&O x3. Normal mood and affect.  Vascular Dorsalis pedis and posterior tibial pulses  present 2+ bilaterally  Capillary refill normal to all digits. Pedal hair growth normal.  Neurologic Epicritic sensation grossly present.  Dermatologic No open lesions. Interspaces clear of maceration. Nails well groomed and normal in appearance.  Orthopedic: MMT 5/5 in dorsiflexion, plantarflexion, inversion, and eversion. Normal joint ROM without pain or crepitus. POP Medial calc tuber L. Decreased AJ ROM L  XR taken and reviewed. No acute fractures or dislocations. Assessment & Plan:  Patient was evaluated and treated and all questions answered.  Plantar Fasciitis, left - XR reviewed as above.  - Educated on icing and stretching. Instructions given.  - Injection delivered to the plantar fascia as below. - Night splint dispensed. - Rx Meloxicam - Cast for CMOs.  Would benefit from applying these to his safety shoes for use at work  Procedure: Injection Tendon/Ligament Location: Left plantar fascia at the glabrous junction; medial approach. Skin Prep: Alcohol. Injectate: 1 cc 0.5% marcaine plain, 1 cc dexamethasone phosphate, 0.5 cc kenalog 10. Disposition: Patient tolerated procedure well. Injection site dressed with a band-aid.     -  No follow-ups on file.

## 2017-08-07 ENCOUNTER — Other Ambulatory Visit: Payer: Self-pay | Admitting: Podiatry

## 2017-08-07 DIAGNOSIS — M216X9 Other acquired deformities of unspecified foot: Secondary | ICD-10-CM

## 2017-08-07 DIAGNOSIS — M722 Plantar fascial fibromatosis: Secondary | ICD-10-CM

## 2017-08-15 ENCOUNTER — Other Ambulatory Visit: Payer: Self-pay | Admitting: Family

## 2017-08-27 ENCOUNTER — Ambulatory Visit: Payer: BLUE CROSS/BLUE SHIELD | Admitting: Podiatry

## 2017-12-23 ENCOUNTER — Ambulatory Visit: Payer: BLUE CROSS/BLUE SHIELD | Admitting: Podiatry

## 2017-12-24 ENCOUNTER — Ambulatory Visit: Payer: BLUE CROSS/BLUE SHIELD | Admitting: Podiatry

## 2017-12-24 DIAGNOSIS — M79672 Pain in left foot: Secondary | ICD-10-CM

## 2017-12-24 DIAGNOSIS — M216X9 Other acquired deformities of unspecified foot: Secondary | ICD-10-CM

## 2017-12-24 DIAGNOSIS — M722 Plantar fascial fibromatosis: Secondary | ICD-10-CM

## 2017-12-24 NOTE — Progress Notes (Signed)
Subjective:  Patient ID: Samuel Knight, male    DOB: 03-Jul-1950,  MRN: [id number]  Chief Complaint  Patient presents with  . Plantar Fasciitis    F/U L PF Pt. states," it still hurting, not as bad today but some dyas it's very panful; 3/10 dull pain." tx: NS and stretching   67 y.o. male presents with the above complaint. History as above.  Review of Systems: Negative except as noted in the HPI. Denies N/V/F/Ch.  Past Medical History:  Diagnosis Date  . Anxiety   . Arthritis   . 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 Depression   . Diverticulosis   . GERD (gastroesophageal reflux disease)   . Glaucoma   . History of colon polyps   . HLD (hyperlipidemia)     Current Outpatient Medications:  .  aspirin 81 MG tablet, Take 81 mg by mouth daily., Disp: , Rfl:  .  buPROPion (WELLBUTRIN SR) 150 MG 12 hr tablet, Take 150 mg by mouth 2 (two) times daily., Disp: , Rfl:  .  Dorzolamide HCl-Timolol Mal PF 22.3-6.8 MG/ML SOLN, Place 1 drop into both eyes 2 (two) times daily., Disp: , Rfl:  .  dorzolamide-timolol (COSOPT) 22.3-6.8 MG/ML ophthalmic solution, Place 1 drop into both eyes daily., Disp: , Rfl:  .  fexofenadine (ALLEGRA) 180 MG tablet, Take 180 mg by mouth 2 (two) times daily., Disp: , Rfl:  .  gemfibrozil (LOPID) 600 MG tablet, Take 600 mg by mouth 2 (two) times daily before a meal., Disp: , Rfl:  .  meloxicam (MOBIC) 15 MG tablet, Take 1 tablet (15 mg total) by mouth daily., Disp: 30 tablet, Rfl: 0 .  MOVIPREP 100 G SOLR, Take 1 kit (200 g total) by mouth once., Disp: 1 kit, Rfl: 0 .  omeprazole (PRILOSEC) 20 MG capsule, Take 20 mg by mouth daily., Disp: , Rfl:  .  PRESCRIPTION MEDICATION, Take by mouth daily. citrucel, Disp: , Rfl:   Social History   Tobacco Use  Smoking Status Former Smoker  . Types: Cigarettes  . Last attempt to quit: 05/12/1999  . Years since quitting: 18.6  Smokeless Tobacco Never Used    Allergies  Allergen Reactions  . Codeine     REACTION: nausea   . Estradiol Valerate Other (See Comments)    unkknown  . Phenergan [Promethazine Hcl] Other (See Comments)    Lightheaded and nervousness   Objective:  There were no vitals filed for this visit. There is no height or weight on file to calculate BMI. Constitutional Well developed. Well nourished.  Vascular Dorsalis pedis pulses palpable bilaterally. Posterior tibial pulses palpable bilaterally. Capillary refill normal to all digits.  No cyanosis or clubbing noted. Pedal hair growth normal.  Neurologic Normal speech. Oriented to person, place, and time. Epicritic sensation to light touch grossly present bilaterally.  Dermatologic Nails well groomed and normal in appearance. No open wounds. No skin lesions.  Orthopedic: Normal joint ROM without pain or crepitus bilaterally. No visible deformities. Tender to palpation at the calcaneal tuber left. No pain with calcaneal squeeze left. Ankle ROM diminished range of motion left. Silfverskiold Test: negative left.   Radiographs: None today  Assessment:   1. Plantar fasciitis   2. Equinus deformity of foot   3. Pain of left heel    Plan:  Patient was evaluated and treated and all questions answered.  Plantar Fasciitis, left - Educated on icing and stretching. Instructions given.  - Injection delivered to the  plantar fascia as below. - DME: PF brace dispensed.  Medically necessary to rest the area until he gets his orthotics  Procedure: Injection Tendon/Ligament Location: Left plantar fascia at the glabrous junction; medial approach. Skin Prep: alcohol Injectate: 1 cc 0.5% marcaine plain, 1 cc dexamethasone phosphate, 0.5 cc kenalog 10. Disposition: Patient tolerated procedure well. Injection site dressed with a band-aid.  No follow-ups on file.

## 2018-01-14 ENCOUNTER — Ambulatory Visit: Payer: BLUE CROSS/BLUE SHIELD | Admitting: Podiatry

## 2024-02-08 ENCOUNTER — Ambulatory Visit (HOSPITAL_BASED_OUTPATIENT_CLINIC_OR_DEPARTMENT_OTHER): Admission: EM | Admit: 2024-02-08 | Discharge: 2024-02-08 | Disposition: A | Discharge status: Home / Self Care

## 2024-02-08 ENCOUNTER — Encounter (HOSPITAL_BASED_OUTPATIENT_CLINIC_OR_DEPARTMENT_OTHER): Payer: Self-pay | Admitting: Emergency Medicine

## 2024-02-08 ENCOUNTER — Ambulatory Visit (HOSPITAL_BASED_OUTPATIENT_CLINIC_OR_DEPARTMENT_OTHER): Admit: 2024-02-08 | Discharge: 2024-02-08 | Disposition: A | Discharge status: Home / Self Care | Admitting: Radiology

## 2024-02-08 DIAGNOSIS — J208 Acute bronchitis due to other specified organisms: Secondary | ICD-10-CM | POA: Diagnosis not present

## 2024-02-08 DIAGNOSIS — R059 Cough, unspecified: Secondary | ICD-10-CM

## 2024-02-08 DIAGNOSIS — R051 Acute cough: Secondary | ICD-10-CM

## 2024-02-08 HISTORY — DX: Essential (primary) hypertension: I10

## 2024-02-08 MED ORDER — ALBUTEROL SULFATE HFA 108 (90 BASE) MCG/ACT IN AERS: 2.0000 | INHALATION_SPRAY | RESPIRATORY_TRACT | 0 refills | Status: AC | PRN

## 2024-02-08 MED ORDER — COMPACT SPACE CHAMBER DEVI: 0 refills | Status: AC

## 2024-02-08 MED ORDER — IPRATROPIUM-ALBUTEROL 0.5-2.5 (3) MG/3ML IN SOLN
3.0000 mL | Freq: Once | RESPIRATORY_TRACT | Status: AC
  Administered 2024-02-08: 3 mL via RESPIRATORY_TRACT

## 2024-02-08 MED ORDER — PREDNISONE 20 MG PO TABS: 20.0000 mg | ORAL_TABLET | Freq: Every day | ORAL | 0 refills | Status: AC

## 2024-02-08 NOTE — ED Provider Notes (Signed)
 " [facility]    CSN: [id number] Arrival date & time: 02/08/24  1042      History   Chief Complaint Chief Complaint  Patient presents with   Cough   chest congestion    HPI Samuel Knight is a 74 y.o. male.   74 year old male with report of cough, fever, chest congestion that started on approximately 02/03/2024 or earlier.  He was seen at another urgent care on 02/04/2024 and diagnosed with influenza type A.  He was not given any prescriptions.  He was told that if he does not improve he should return for reevaluation.  He has not had fever since 02/06/2024 or earlier.  But he has had a persistent cough, shortness of breath and now he is hearing wheezing at times.   Cough Associated symptoms: shortness of breath and wheezing   Associated symptoms: no chest pain, no chills, no ear pain, no fever, no rash and no sore throat     Past Medical History:  Diagnosis Date   Anxiety    Arthritis    Cancer ([facility])    right eye lid   Depression    Diverticulosis    GERD (gastroesophageal reflux disease)    Glaucoma    History of colon polyps    HLD (hyperlipidemia)    Hypertension     Patient Active Problem List   Diagnosis Date Noted   Hyperlipidemia 05/12/2014   GERD (gastroesophageal reflux disease) 05/12/2014   Hx of adenomatous colonic polyps 05/12/2014    Past Surgical History:  Procedure Laterality Date   EYE Lid Surgery Right    cancer   WRIST SURGERY Right    cyst removal        Home Medications    Prior to Admission medications  Medication Sig Start Date End Date Taking? Authorizing Provider  albuterol  (VENTOLIN  HFA) 108 (90 Base) MCG/ACT inhaler Inhale 2 puffs into the lungs every 4 (four) hours as needed for wheezing or shortness of breath. 02/08/24  Yes Ival Domino, FNP  amLODipine (NORVASC) 2.5 MG tablet Take 2.5 mg by mouth.   Yes [provider]  gemfibrozil (LOPID) 600 MG tablet Take 600 mg by mouth 2 (two) times daily before a  meal.   Yes [provider]  omeprazole (PRILOSEC) 20 MG capsule Take 20 mg by mouth daily.   Yes [provider]  predniSONE  (DELTASONE ) 20 MG tablet Take 1 tablet (20 mg total) by mouth daily with breakfast for 5 days. 02/08/24 02/13/24 Yes Ival Domino, FNP  Spacer/Aero-Holding Chambers (COMPACT SPACE CHAMBER) DEVI Use with the albuterol  inhaler 02/08/24  Yes Ival Domino, FNP  aspirin 81 MG tablet Take 81 mg by mouth daily.    [provider]  Dorzolamide HCl-Timolol Mal PF 22.3-6.8 MG/ML SOLN Place 1 drop into both eyes 2 (two) times daily.    [provider]  dorzolamide-timolol (COSOPT) 22.3-6.8 MG/ML ophthalmic solution Place 1 drop into both eyes daily.    [provider]  fexofenadine (ALLEGRA) 180 MG tablet Take 180 mg by mouth 2 (two) times daily.    [provider]  meloxicam  (MOBIC ) 15 MG tablet Take 1 tablet (15 mg total) by mouth daily. 08/06/17   Gretel Ozell JINNY, DPM  MOVIPREP  100 G SOLR Take 1 kit (200 g total) by mouth once. 12/04/14   Nandigam, Kavitha V, MD  PRESCRIPTION MEDICATION Take by mouth daily. citrucel    [provider]    Family History Family History  Problem Relation Age of Onset   Breast cancer Mother    Lung cancer Brother    Throat cancer Brother    Emphysema Father    Alcoholism Father    Colon cancer Neg Hx     Social History Social History[1]   Allergies   Codeine, Estradiol valerate, Phenergan [promethazine hcl], and Sertraline   Review of Systems Review of Systems  Constitutional:  Negative for chills and fever.  HENT:  Negative for ear pain and sore throat.   Eyes:  Negative for pain and visual disturbance.  Respiratory:  Positive for cough, shortness of breath and wheezing.   Cardiovascular:  Negative for chest pain and palpitations.  Gastrointestinal:  Negative for abdominal pain, constipation, diarrhea, nausea and vomiting.  Genitourinary:  Negative for dysuria and  hematuria.  Musculoskeletal:  Negative for arthralgias and back pain.  Skin:  Negative for color change and rash.  Neurological:  Negative for seizures and syncope.  All other systems reviewed and are negative.    Physical Exam Triage Vital Signs ED Triage Vitals  Encounter Vitals Group     BP 02/08/24 1058 139/82     Girls Systolic BP Percentile --      Girls Diastolic BP Percentile --      Boys Systolic BP Percentile --      Boys Diastolic BP Percentile --      Pulse Rate 02/08/24 1058 96     Resp 02/08/24 1058 18     Temp 02/08/24 1058 98.5 F (36.9 C)     Temp Source 02/08/24 1058 Oral     SpO2 02/08/24 1058 94 %     Weight --      Height --      Head Circumference --      Peak Flow --      Pain Score 02/08/24 1055 0     Pain Loc --      Pain Education --      Exclude from Growth Chart --    No data found.  Updated Vital Signs BP 139/82 (BP Location: Right Arm)   Pulse 96   Temp 98.5 F (36.9 C) (Oral)   Resp 18   SpO2 94%   Visual Acuity Right Eye Distance:   Left Eye Distance:   Bilateral Distance:    Right Eye Near:   Left Eye Near:    Bilateral Near:     Physical Exam Vitals and nursing note reviewed.  Constitutional:      General: He is not in acute distress.    Appearance: He is well-developed. He is ill-appearing. He is not toxic-appearing or diaphoretic.  HENT:     Head: Normocephalic and atraumatic.     Right Ear: Hearing, tympanic membrane, ear canal and external ear normal.     Left Ear: Hearing, tympanic membrane, ear canal and external ear normal.     Nose: Congestion and rhinorrhea present. Rhinorrhea is clear.     Right Sinus: No maxillary sinus tenderness or frontal sinus tenderness.     Left Sinus: No maxillary sinus tenderness or frontal sinus tenderness.     Mouth/Throat:     Lips: Pink.     Mouth: Mucous membranes are moist.     Pharynx: Uvula midline. No oropharyngeal exudate or posterior oropharyngeal erythema.     Tonsils:  No tonsillar exudate.  Eyes:     Conjunctiva/sclera: Conjunctivae normal.     Pupils: Pupils are equal, round, and reactive to light.  Cardiovascular:     Rate and Rhythm: Normal rate and regular rhythm.     Heart sounds: S1 normal and S2 normal. No murmur heard. Pulmonary:     Effort: Pulmonary effort is normal. No respiratory distress.     Breath sounds: Examination of the right-upper field reveals wheezing and rhonchi. Examination of the left-upper field reveals wheezing and rhonchi. Examination of the right-middle field reveals wheezing. Examination of the left-middle field reveals wheezing. Examination of the right-lower field reveals wheezing. Examination of the left-lower field reveals wheezing. Wheezing (Inspiratory wheezes throughout.) and rhonchi (Mild, intermittent rhonchi in the upper lobes only.) present. No decreased breath sounds or rales.     Comments: Oxygen saturation is 93% on room air.  Patient has inspiratory wheezes throughout and some mild upper airway rhonchi.  Reassessment after DuoNeb treatment: Oxygen sat duration is 95% on room air after nebulizer treatment.  Wheezing and rhonchi cleared completely with the treatment. Abdominal:     General: Bowel sounds are normal.     Palpations: Abdomen is soft.     Tenderness: There is no abdominal tenderness.  Musculoskeletal:        General: No swelling.     Cervical back: Neck supple.  Lymphadenopathy:     Head:     Right side of head: No submental, submandibular, tonsillar, preauricular or posterior auricular adenopathy.     Left side of head: No submental, submandibular, tonsillar, preauricular or posterior auricular adenopathy.     Cervical: No cervical adenopathy.     Right cervical: No superficial cervical adenopathy.    Left cervical: No superficial cervical adenopathy.  Skin:    General: Skin is warm and dry.     Capillary Refill: Capillary refill takes less than 2 seconds.     Findings: No rash.  Neurological:      Mental Status: He is alert and oriented to person, place, and time.  Psychiatric:        Mood and Affect: Mood normal.      UC Treatments / Results  Labs (all labs ordered are listed, but only abnormal results are displayed) Labs Reviewed - No data to display  EKG   Radiology DG Chest 2 View Result Date: 02/08/2024 EXAM: 2 VIEW(S) XRAY OF THE CHEST 02/08/2024 11:39:00 AM COMPARISON: 12/30/13. CLINICAL HISTORY: Cough. FINDINGS: LUNGS AND PLEURA: No focal pulmonary opacity. No pleural effusion. No pneumothorax. HEART AND MEDIASTINUM: No acute abnormality of the cardiac and mediastinal silhouettes. BONES AND SOFT TISSUES: No acute osseous abnormality. IMPRESSION: 1. No acute cardiopulmonary process. Electronically signed by: Norman Gatlin MD 02/08/2024 12:16 PM EST RP Workstation: [id number]    Procedures Procedures (including critical care time)  Medications Ordered in UC Medications  ipratropium-albuterol  (DUONEB) 0.5-2.5 (3) MG/3ML nebulizer solution 3 mL (3 mLs Nebulization Given 02/08/24 1146)    Initial Impression / Assessment and Plan / UC Course  I have reviewed the triage vital signs and the nursing notes.  Pertinent labs & imaging results that were available during my care of the patient were reviewed by me and considered in my medical decision making (see chart for details).  Plan of Care (see discharge instructions for additional patient precautions and education): Acute viral bronchitis with cough after influenza type a infection: Chest x-ray appears normal or clear.  Huge improvement in breath sounds after DuoNeb treatment.  Patient is allergic to promethazine so cough syrup not provided.  Could use Delsym OTC if needed for cough.  Albuterol  inhaler with spacer, 2 puffs,  every 4 hours if needed for wheezing.  Prednisone  20 mg daily for 5 days.  Get plenty of fluids and rest.  Follow-up if symptoms do not improve, worsen or new symptoms occur.  I reviewed the plan  of care with the patient and/or the patient's guardian.  The patient and/or guardian had time to ask questions and acknowledged that the questions were answered.  Final Clinical Impressions(s) / UC Diagnoses   Final diagnoses:  Acute cough  Acute viral bronchitis     Discharge Instructions      Acute viral bronchitis with cough after influenza type a infection: Chest x-ray appears normal or clear.  Huge improvement in breath sounds after DuoNeb treatment.  Patient is allergic to promethazine so cough syrup not provided.  Could use Delsym OTC if needed for cough.  Albuterol  inhaler with spacer, 2 puffs, every 4 hours if needed for wheezing.  Prednisone  20 mg daily for 5 days.  Get plenty of fluids and rest.  Follow-up if symptoms do not improve, worsen or new symptoms occur.     ED Prescriptions     Medication Sig Dispense Auth. Provider   albuterol  (VENTOLIN  HFA) 108 (90 Base) MCG/ACT inhaler Inhale 2 puffs into the lungs every 4 (four) hours as needed for wheezing or shortness of breath. 1 each Ival Domino, FNP   Spacer/Aero-Holding Chambers (COMPACT SPACE CHAMBER) DEVI Use with the albuterol  inhaler 1 each Ival Domino, FNP   predniSONE  (DELTASONE ) 20 MG tablet Take 1 tablet (20 mg total) by mouth daily with breakfast for 5 days. 5 tablet Luellen Howson, FNP      PDMP not reviewed this encounter.    [1]  Social History Tobacco Use   Smoking status: Former    Current packs/day: 0.00    Types: Cigarettes    Quit date: 05/12/1999    Years since quitting: 24.7   Smokeless tobacco: Never  Substance Use Topics   Alcohol use: No    Alcohol/week: 0.0 standard drinks of alcohol   Drug use: No     Ival Domino, FNP 02/08/24 1220  "

## 2024-02-08 NOTE — Discharge Instructions (Addendum)
 Acute viral bronchitis with cough after influenza type a infection: Chest x-ray appears normal or clear.  Huge improvement in breath sounds after DuoNeb treatment.  Patient is allergic to promethazine so cough syrup not provided.  Could use Delsym OTC if needed for cough.  Albuterol  inhaler with spacer, 2 puffs, every 4 hours if needed for wheezing.  Prednisone  20 mg daily for 5 days.  Get plenty of fluids and rest.  Follow-up if symptoms do not improve, worsen or new symptoms occur.

## 2024-02-08 NOTE — ED Triage Notes (Signed)
 Pt reports x 1 week chest congestion, coughing, and fever he was diagnosed on 1/20 at another urgent care and he was flu A positive. Pt was told if not any better to return they did not prescribe any medications.
